# Patient Record
Sex: Female | Born: 1985 | Race: Black or African American | Hispanic: No | Marital: Single
Health system: Southern US, Community
[De-identification: ages and names within clinical notes are randomized; demographics above are authoritative.]

---

## 2012-09-04 ENCOUNTER — Emergency Department: Payer: Self-pay | Admitting: Emergency Medicine

## 2012-09-04 LAB — CBC
HGB: 13.9 g/dL (ref 12.0–16.0)
MCHC: 32.9 g/dL (ref 32.0–36.0)
Platelet: 283 10*3/uL (ref 150–440)
RBC: 4.74 10*6/uL (ref 3.80–5.20)
RDW: 13.4 % (ref 11.5–14.5)
WBC: 14.5 10*3/uL — ABNORMAL HIGH (ref 3.6–11.0)

## 2012-09-04 LAB — URINALYSIS, COMPLETE
Bilirubin,UR: NEGATIVE
RBC,UR: 2 /HPF (ref 0–5)
Specific Gravity: 1.028 (ref 1.003–1.030)
Squamous Epithelial: 20
WBC UR: 14 /HPF (ref 0–5)

## 2012-09-04 LAB — DRUG SCREEN, URINE
Amphetamines, Ur Screen: NEGATIVE (ref ?–1000)
Benzodiazepine, Ur Scrn: NEGATIVE (ref ?–200)
Cocaine Metabolite,Ur ~~LOC~~: NEGATIVE (ref ?–300)
Methadone, Ur Screen: NEGATIVE (ref ?–300)
Opiate, Ur Screen: NEGATIVE (ref ?–300)
Tricyclic, Ur Screen: NEGATIVE (ref ?–1000)

## 2012-09-04 LAB — COMPREHENSIVE METABOLIC PANEL
Alkaline Phosphatase: 110 U/L (ref 50–136)
BUN: 7 mg/dL (ref 7–18)
Co2: 24 mmol/L (ref 21–32)
Creatinine: 0.7 mg/dL (ref 0.60–1.30)
EGFR (African American): >60
EGFR (Non-African Amer.): >60
Osmolality: 272 (ref 275–301)
SGPT (ALT): 20 U/L (ref 12–78)

## 2012-09-04 LAB — RAPID HIV-1/2 QL/CONFIRM: HIV-1/2,Rapid Ql: NEGATIVE

## 2012-09-04 LAB — TSH: Thyroid Stimulating Horm: 3.22 u[IU]/mL

## 2012-09-06 ENCOUNTER — Emergency Department: Payer: Self-pay | Admitting: Emergency Medicine

## 2012-09-06 LAB — DRUG SCREEN, URINE
Amphetamines, Ur Screen: NEGATIVE (ref ?–1000)
Cannabinoid 50 Ng, Ur ~~LOC~~: POSITIVE (ref ?–50)
Cocaine Metabolite,Ur ~~LOC~~: NEGATIVE (ref ?–300)
MDMA (Ecstasy)Ur Screen: NEGATIVE (ref ?–500)
Phencyclidine (PCP) Ur S: NEGATIVE (ref ?–25)
Tricyclic, Ur Screen: NEGATIVE (ref ?–1000)

## 2012-09-06 LAB — CBC
HGB: 13.9 g/dL (ref 12.0–16.0)
RBC: 4.72 10*6/uL (ref 3.80–5.20)

## 2012-09-06 LAB — TSH: Thyroid Stimulating Horm: 1.91 u[IU]/mL

## 2012-09-06 LAB — URINALYSIS, COMPLETE
Bilirubin,UR: NEGATIVE
Ph: 5 (ref 4.5–8.0)
RBC,UR: 1 /HPF (ref 0–5)
Squamous Epithelial: 4

## 2012-09-06 LAB — COMPREHENSIVE METABOLIC PANEL
Albumin: 4.1 g/dL (ref 3.4–5.0)
Alkaline Phosphatase: 111 U/L (ref 50–136)
BUN: 12 mg/dL (ref 7–18)
Bilirubin,Total: 0.4 mg/dL (ref 0.2–1.0)
Calcium, Total: 9.2 mg/dL (ref 8.5–10.1)
Chloride: 105 mmol/L (ref 98–107)
Co2: 20 mmol/L — ABNORMAL LOW (ref 21–32)
Creatinine: 0.85 mg/dL (ref 0.60–1.30)
EGFR (African American): >60
EGFR (Non-African Amer.): >60
Osmolality: 275 (ref 275–301)
Potassium: 3.8 mmol/L (ref 3.5–5.1)
SGOT(AST): 29 U/L (ref 15–37)
SGPT (ALT): 21 U/L (ref 12–78)

## 2012-09-06 LAB — ETHANOL: Ethanol %: <0.003 % (ref 0.000–0.080)

## 2012-09-06 LAB — SALICYLATE LEVEL: Salicylates, Serum: 2.2 mg/dL

## 2012-09-07 LAB — URINE CULTURE

## 2013-07-10 ENCOUNTER — Emergency Department: Payer: Self-pay | Admitting: Emergency Medicine

## 2013-07-26 ENCOUNTER — Emergency Department: Payer: Self-pay | Admitting: Emergency Medicine

## 2013-07-29 LAB — BETA STREP CULTURE(ARMC)

## 2013-08-22 ENCOUNTER — Emergency Department: Payer: Self-pay | Admitting: Emergency Medicine

## 2013-08-23 LAB — COMPREHENSIVE METABOLIC PANEL
Anion Gap: 5 — ABNORMAL LOW (ref 7–16)
BUN: 9 mg/dL (ref 7–18)
Bilirubin,Total: 0.4 mg/dL (ref 0.2–1.0)
Calcium, Total: 8.9 mg/dL (ref 8.5–10.1)
Chloride: 107 mmol/L (ref 98–107)
Creatinine: 0.6 mg/dL (ref 0.60–1.30)
Potassium: 3.7 mmol/L (ref 3.5–5.1)
SGOT(AST): 24 U/L (ref 15–37)
Sodium: 137 mmol/L (ref 136–145)
Total Protein: 8.5 g/dL — ABNORMAL HIGH (ref 6.4–8.2)

## 2013-08-23 LAB — CBC WITH DIFFERENTIAL/PLATELET
Basophil #: 0.1 10*3/uL (ref 0.0–0.1)
Eosinophil #: 0 10*3/uL (ref 0.0–0.7)
Eosinophil %: 0.2 %
HGB: 11.8 g/dL — ABNORMAL LOW (ref 12.0–16.0)
Lymphocyte #: 2.6 10*3/uL (ref 1.0–3.6)
Lymphocyte %: 25.8 %
MCH: 30.2 pg (ref 26.0–34.0)
MCV: 88 fL (ref 80–100)
Monocyte #: 0.6 x10 3/mm (ref 0.2–0.9)
Monocyte %: 5.9 %
Neutrophil #: 6.8 10*3/uL — ABNORMAL HIGH (ref 1.4–6.5)
Neutrophil %: 67.6 %
Platelet: 278 10*3/uL (ref 150–440)
RBC: 3.89 10*6/uL (ref 3.80–5.20)

## 2013-08-23 LAB — URINALYSIS, COMPLETE
Blood: NEGATIVE
Leukocyte Esterase: NEGATIVE
Nitrite: NEGATIVE
Ph: 6 (ref 4.5–8.0)
Protein: NEGATIVE
RBC,UR: 1 /HPF (ref 0–5)
Squamous Epithelial: 8
WBC UR: 2 /HPF (ref 0–5)

## 2013-08-25 ENCOUNTER — Emergency Department: Payer: Self-pay | Admitting: Emergency Medicine

## 2013-09-12 ENCOUNTER — Emergency Department: Payer: Self-pay | Admitting: Emergency Medicine

## 2013-10-21 ENCOUNTER — Emergency Department: Payer: Self-pay | Admitting: Emergency Medicine

## 2013-10-21 LAB — URINALYSIS, COMPLETE
BILIRUBIN, UR: NEGATIVE
GLUCOSE, UR: NEGATIVE mg/dL (ref 0–75)
KETONE: NEGATIVE
LEUKOCYTE ESTERASE: NEGATIVE
Nitrite: NEGATIVE
Ph: 7 (ref 4.5–8.0)
Protein: NEGATIVE
RBC,UR: 1 /HPF (ref 0–5)
Specific Gravity: 1.018 (ref 1.003–1.030)

## 2013-10-21 LAB — CBC WITH DIFFERENTIAL/PLATELET
BASOS PCT: 0.9 %
Basophil #: 0.1 10*3/uL (ref 0.0–0.1)
EOS PCT: 1.4 %
Eosinophil #: 0.2 10*3/uL (ref 0.0–0.7)
HCT: 36 % (ref 35.0–47.0)
HGB: 12 g/dL (ref 12.0–16.0)
Lymphocyte #: 5 10*3/uL — ABNORMAL HIGH (ref 1.0–3.6)
Lymphocyte %: 35.2 %
MCH: 28.7 pg (ref 26.0–34.0)
MCHC: 33.3 g/dL (ref 32.0–36.0)
MCV: 86 fL (ref 80–100)
MONO ABS: 0.7 x10 3/mm (ref 0.2–0.9)
Monocyte %: 5.1 %
Neutrophil #: 8.2 10*3/uL — ABNORMAL HIGH (ref 1.4–6.5)
Neutrophil %: 57.4 %
Platelet: 276 10*3/uL (ref 150–440)
RBC: 4.18 10*6/uL (ref 3.80–5.20)
RDW: 12.8 % (ref 11.5–14.5)
WBC: 14.2 10*3/uL — ABNORMAL HIGH (ref 3.6–11.0)

## 2013-10-21 LAB — COMPREHENSIVE METABOLIC PANEL
ALBUMIN: 3.5 g/dL (ref 3.4–5.0)
ALK PHOS: 94 U/L
ANION GAP: 5 — AB (ref 7–16)
AST: 14 U/L — AB (ref 15–37)
BUN: 13 mg/dL (ref 7–18)
Bilirubin,Total: 0.2 mg/dL (ref 0.2–1.0)
CHLORIDE: 103 mmol/L (ref 98–107)
CREATININE: 0.5 mg/dL — AB (ref 0.60–1.30)
Calcium, Total: 9.4 mg/dL (ref 8.5–10.1)
Co2: 26 mmol/L (ref 21–32)
EGFR (African American): >60
Glucose: 83 mg/dL (ref 65–99)
OSMOLALITY: 267 (ref 275–301)
POTASSIUM: 3.5 mmol/L (ref 3.5–5.1)
SGPT (ALT): 20 U/L (ref 12–78)
SODIUM: 134 mmol/L — AB (ref 136–145)
Total Protein: 8.1 g/dL (ref 6.4–8.2)

## 2013-10-21 LAB — HCG, QUANTITATIVE, PREGNANCY: BETA HCG, QUANT.: 73192 m[IU]/mL — AB

## 2013-10-21 LAB — WET PREP, GENITAL

## 2013-10-22 LAB — GC/CHLAMYDIA PROBE AMP

## 2013-12-16 ENCOUNTER — Emergency Department: Payer: Self-pay | Admitting: Emergency Medicine

## 2013-12-16 LAB — COMPREHENSIVE METABOLIC PANEL WITH GFR
Albumin: 2.9 g/dL — ABNORMAL LOW
Alkaline Phosphatase: 89 U/L
Anion Gap: 4 — ABNORMAL LOW
BUN: 9 mg/dL
Bilirubin,Total: 0.1 mg/dL — ABNORMAL LOW
Calcium, Total: 8.7 mg/dL
Chloride: 106 mmol/L
Co2: 25 mmol/L
Creatinine: 0.55 mg/dL — ABNORMAL LOW
EGFR (African American): >60
EGFR (Non-African Amer.): >60
Glucose: 81 mg/dL
Osmolality: 268
Potassium: 3.6 mmol/L
SGOT(AST): 23 U/L
SGPT (ALT): 21 U/L
Sodium: 135 mmol/L — ABNORMAL LOW
Total Protein: 7.5 g/dL

## 2013-12-16 LAB — URINALYSIS, COMPLETE
Bilirubin,UR: NEGATIVE
Blood: NEGATIVE
GLUCOSE, UR: NEGATIVE mg/dL (ref 0–75)
KETONE: NEGATIVE
Leukocyte Esterase: NEGATIVE
Nitrite: NEGATIVE
PH: 6 (ref 4.5–8.0)
Protein: NEGATIVE
RBC,UR: 1 /HPF (ref 0–5)
Specific Gravity: 1.023 (ref 1.003–1.030)
Squamous Epithelial: 3
WBC UR: 1 /HPF (ref 0–5)

## 2013-12-16 LAB — CBC
HCT: 33.4 % — AB (ref 35.0–47.0)
HGB: 11.1 g/dL — AB (ref 12.0–16.0)
MCH: 29 pg (ref 26.0–34.0)
MCHC: 33.2 g/dL (ref 32.0–36.0)
MCV: 87 fL (ref 80–100)
Platelet: 268 10*3/uL (ref 150–440)
RBC: 3.83 10*6/uL (ref 3.80–5.20)
RDW: 13.5 % (ref 11.5–14.5)
WBC: 15.9 10*3/uL — AB (ref 3.6–11.0)

## 2013-12-16 LAB — HCG, QUANTITATIVE, PREGNANCY: Beta Hcg, Quant.: 72794 m[IU]/mL — ABNORMAL HIGH

## 2014-02-14 IMAGING — CT CT ABD-PELV W/ CM
1 of 2 series · 15 of 32 positions shown, 19 images · non-contrast
Comparison: none

REASON FOR EXAM: (1) LLQ pain, leukocytosis; (2) LLQ pain, leukocytosis
COMMENTS:

[Series 2: 3mm soft tissue · axial · 0.64mm/px · z∈[-474,-87]mm · 15 of 141 slices shown, 19 images]
[im 6/141  soft-tissue]
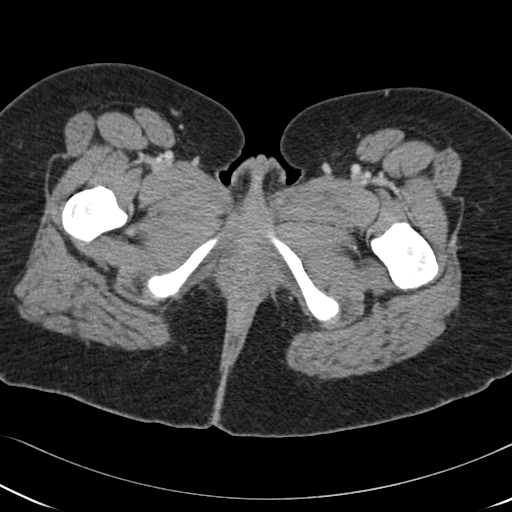
[im 6/141  bone]
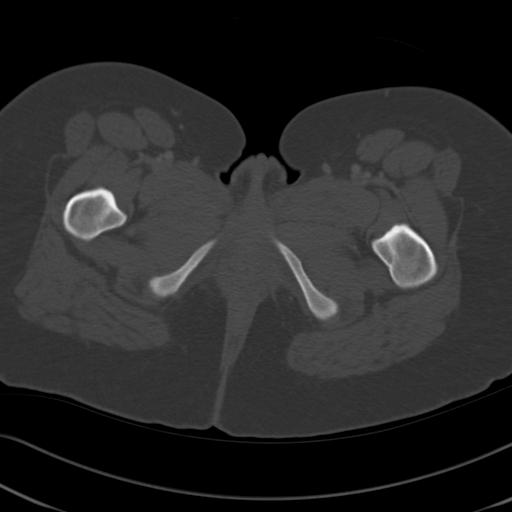
[im 18/141  soft-tissue]
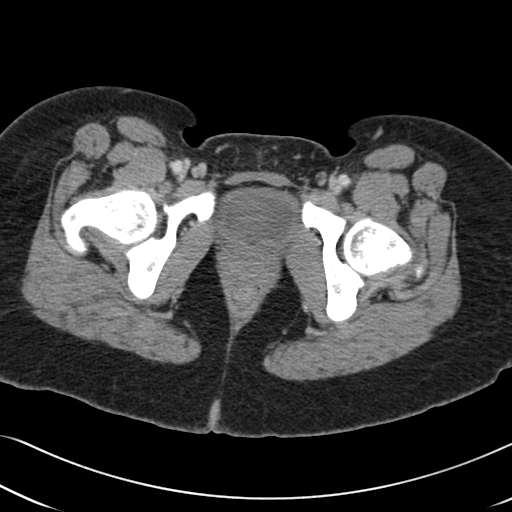
[im 30/141  soft-tissue]
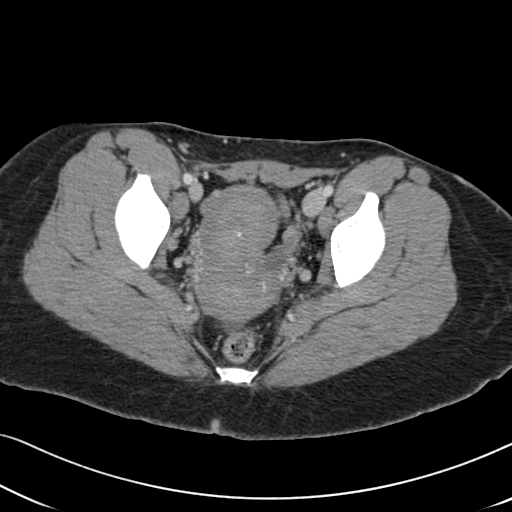
[im 41/141  soft-tissue]
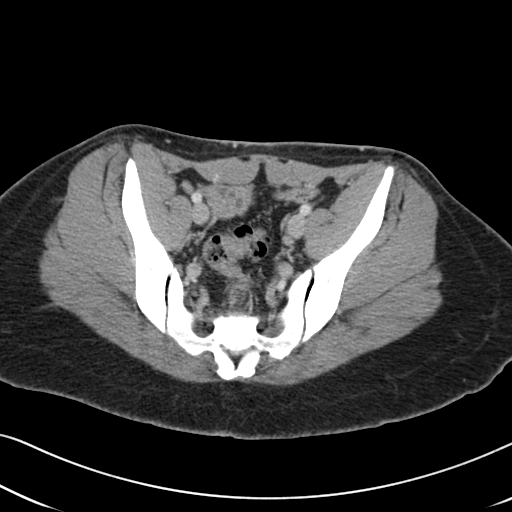
[im 47/141  soft-tissue]
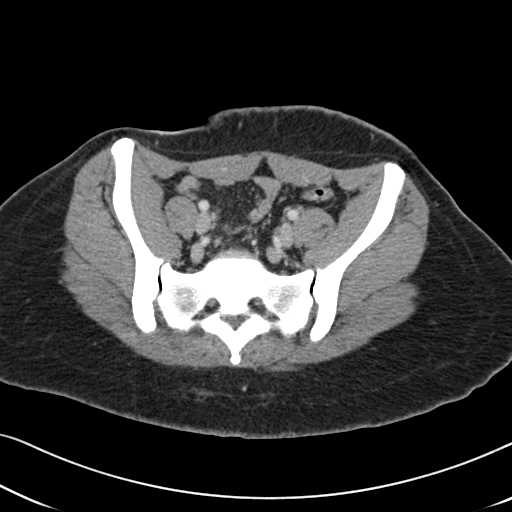
[im 59/141  soft-tissue]
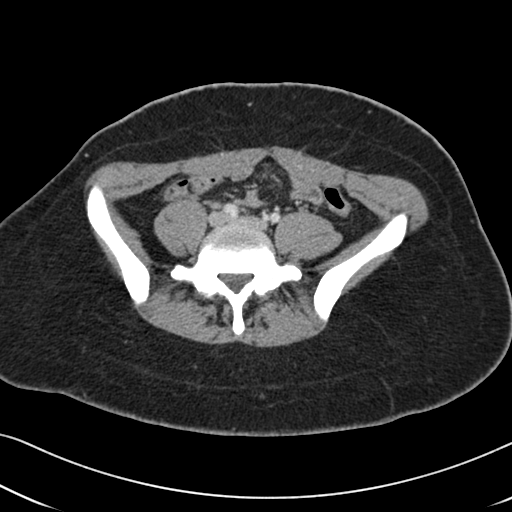
[im 71/141  soft-tissue]
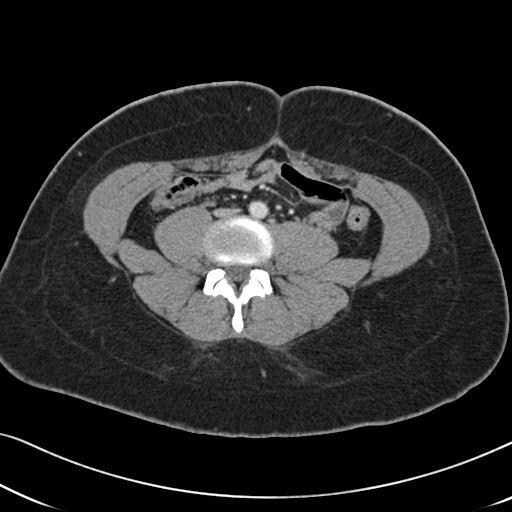
[im 82/141  soft-tissue]
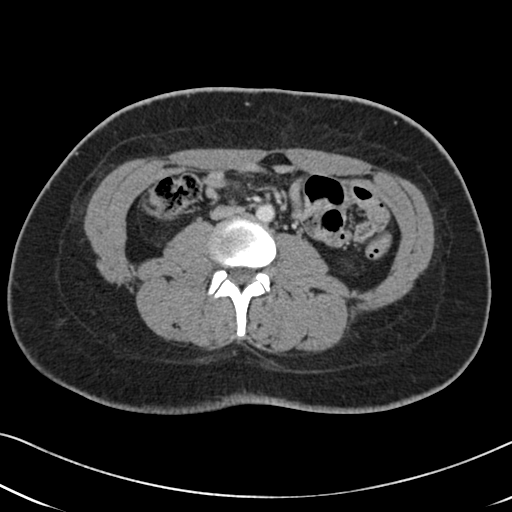
[im 94/141  soft-tissue]
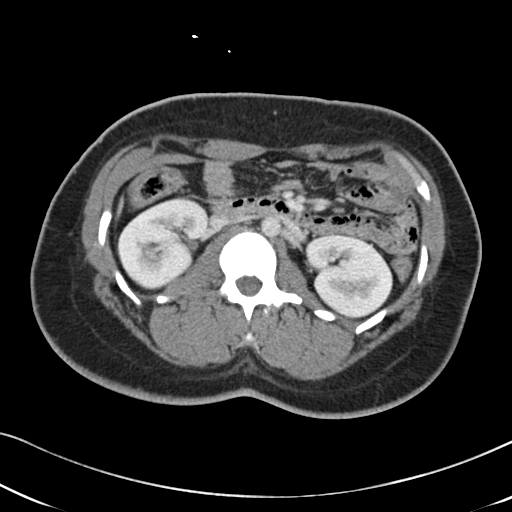
[im 94/141  bone]
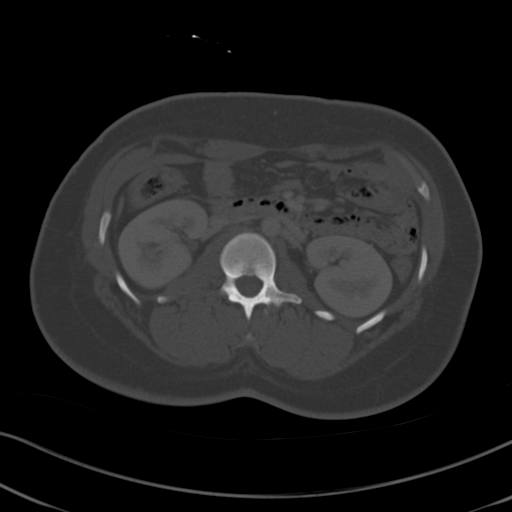
[im 100/141  soft-tissue]
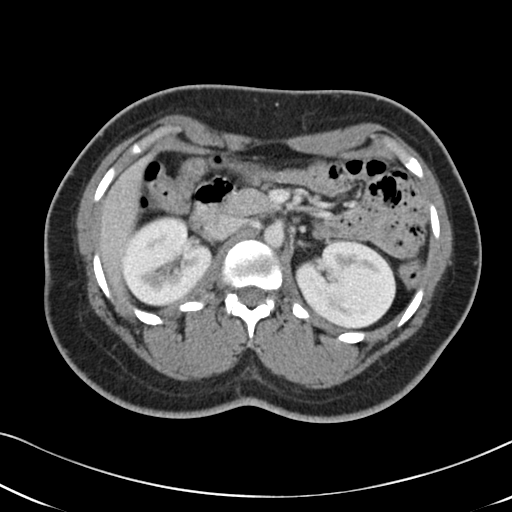
[im 111/141  soft-tissue]
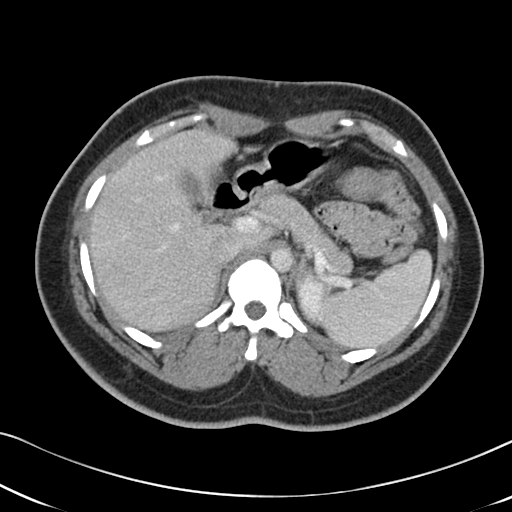
[im 117/141  lung]
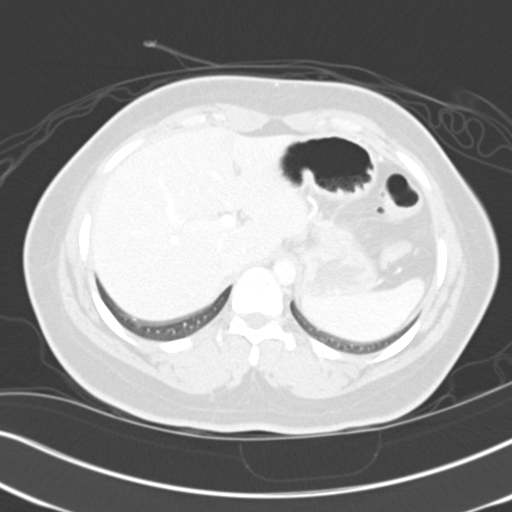
[im 123/141  soft-tissue]
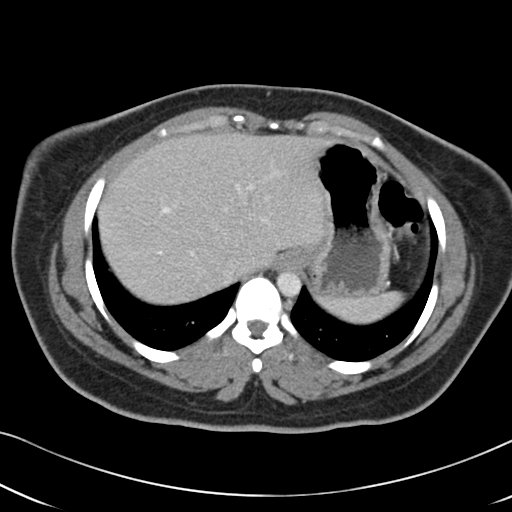
[im 123/141  lung]
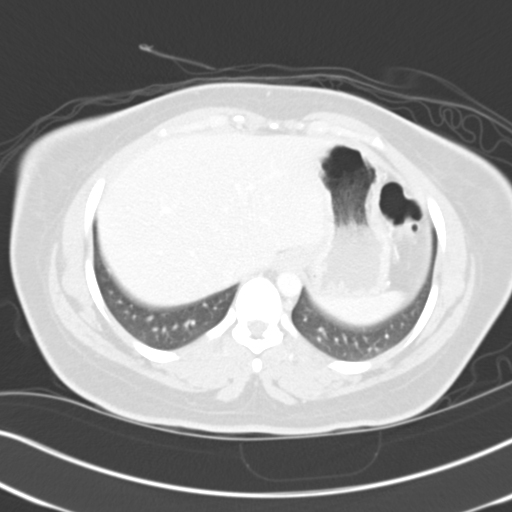
[im 129/141  lung]
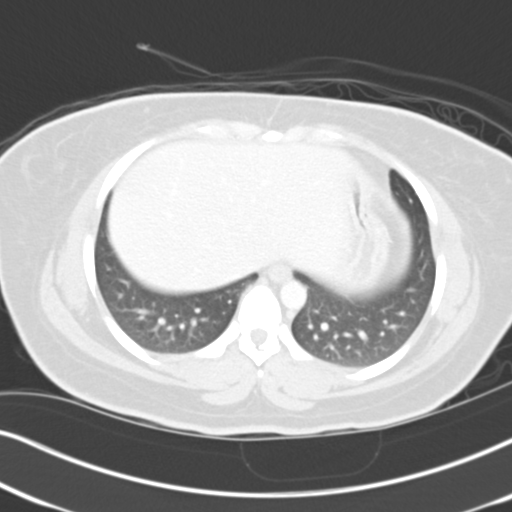
[im 135/141  soft-tissue]
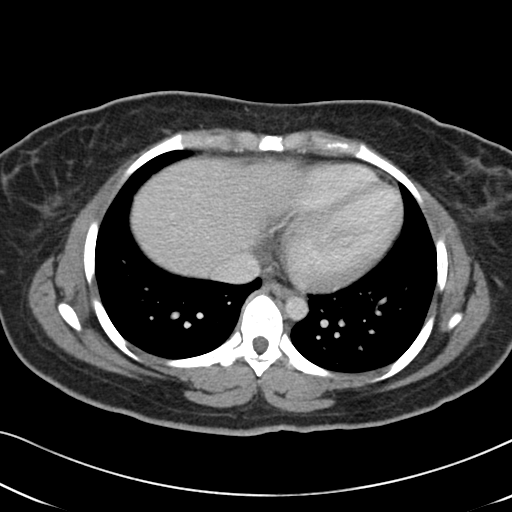
[im 135/141  lung]
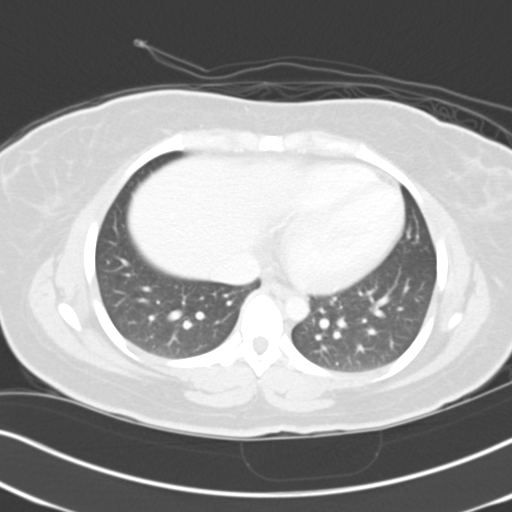

[15 of 32 positions shown; findings below may reference images not displayed]

PROCEDURE:     CT  - CT ABDOMEN / PELVIS  W  - September 04, 2012  [DATE]

RESULT:     CT of the abdomen and pelvis is performed with 75 mL of
Tsovue-YTN iodinated intravenous contrast. No oral contrast is utilized.
Images are reconstructed at 3 mm slice thickness in the axial plane. There
is no previous study for comparison.

There is a cystic structure measuring 2.3 cm diameter on image #108 in the
left adnexal region most likely a simple left ovarian cyst or dominant
follicle. There is no evidence of abnormal bowel distention or wall
thickening. The lung bases show no pleural effusion. There is no
pneumothorax. There is minimal dependent atelectasis without a focal
pulmonary nodule inferiorly. There is some respiratory motion artifact. The
liver shows no mass or ductal dilation. The gallbladder, pancreas, spleen,
aorta, kidneys and adrenal glands appear unremarkable. There is no acute
inflammatory process. A normal-appearing appendix is present. No abnormal
bowel distention is demonstrated. An intrauterine device is present in the
uterus. The urinary bladder is nondistended.
IMPRESSION: 1. Left ovarian cyst or dominant follicle with a trace amount of fluid in
the pelvis which is likely physiologic.

[REDACTED]

## 2014-04-01 ENCOUNTER — Emergency Department: Payer: Self-pay | Admitting: Emergency Medicine

## 2015-02-05 NOTE — Consult Note (Signed)
PATIENT NAME:  Stacie Mathis, ALKINS MR#:  [id number] DATE OF BIRTH:  November 21, 1985  DATE OF CONSULTATION:  09/05/2012  REFERRING PHYSICIAN:  Lowella Fairy, MD  CONSULTING PHYSICIAN:  Livio Ledwith B. Isador Castille, MD  REASON FOR CONSULTATION: To evaluate a patient with altered mental status.   IDENTIFYING DATA: Stacie Mathis is a 29 year old female with no past psychiatric history.   CHIEF COMPLAINT: "I have a bellyache".  HISTORY OF PRESENT ILLNESS: Stacie Mathis showed up in the Emergency Room initially complaining of abdominal pain, very frightened that unprotected sex she had two weeks ago might result in sexually transmitted disease, especially HIV. She been very anxious and decided to come to the hospital for testing. She was rather strange and not forthcoming with any information in the Emergency Room and this led to her involuntary inpatient psychiatric commitment. I do not believe that at any time the patient threatened suicide or homicide but was fair uncooperative and difficult to evaluate. By the time I get to see her the next morning, she is pleasant, polite, and cooperative and much more forthcoming with information. We were also able to get in touch with her aunt who brought her up and her cousin who provided some information. The patient reports that she has no mental illness problems and is not interested in treatment. Per her cousin the patient has postpartum depression even though her child is three years old but has not been willing to seek any help for that. She has a pattern of agitated uncontrolled behavior that leads to job losses. The family denies that there is any problem with alcohol or drugs. They would like the patient to be treated but they know that the patient ordinarily refuses. The patient herself denies any symptoms of depression, anxiety, or psychosis. There are no symptoms of bipolar mania. She denies alcohol, illicit drugs, or prescription pill abuse.   PAST PSYCHIATRIC HISTORY: As  above, possibility of postpartum depression never treated. The family thinks that she is bipolar.   FAMILY PSYCHIATRIC HISTORY: None reported.   PAST MEDICAL HISTORY: None.   ALLERGIES: No known drug allergies.   MEDICATIONS ON ADMISSION: None.   SOCIAL HISTORY: She lives with her 31-year-old daughter. She was brought up by her aunt. The patient and her cousin grew up together. Unfortunately, the patient has not been able to keep a job. 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 The patient thinks that this is because she had no place to stay in [place]. She takes good care of her 92-year-old child. There is no child support. She was last employed two weeks ago and has been looking for a job since.  REVIEW OF SYSTEMS: CONSTITUTIONAL: No fevers or chills. No weight changes. EYES: No double or blurred vision. ENT: No hearing loss. RESPIRATORY: No shortness of breath or cough. CARDIOVASCULAR: No chest pain or orthopnea. GASTROINTESTINAL: No abdominal pain, nausea, vomiting, or diarrhea. GU: No incontinence or frequency. ENDOCRINE: No heat or cold intolerance. LYMPHATIC: No anemia or easy bruising. INTEGUMENTARY: No acne or rash. MUSCULOSKELETAL: No muscle or joint pain. NEUROLOGIC: No tingling or weakness. PSYCHIATRIC: See history of present illness for details.   PHYSICAL EXAMINATION:   VITAL SIGNS: Blood pressure 123/61, pulse 84, respirations 18, temperature 98.   GENERAL: This is a well developed young female in no acute distress.   The rest of the physical examination is deferred to her primary attending.   LABORATORY  DATA: Chemistries are within normal limits except for blood glucose of 105. LFTs within normal limits except for high protein of 9.1. TSH 3.22. Urine tox screen positive for cannabinoids. CBC within normal limits except for white count of 14.5. Urinalysis is suggestive of urinary tract infection. Serum  acetaminophen less than 2. Rapid HIV test negative. Wet prep many white blood cells, no yeast, no Trichomonas, no spermatozoa.  MENTAL STATUS EXAMINATION: The patient is alert and oriented to person, place, time, and situation. She is pleasant, polite, and cooperative. She was not forthcoming with information, hiding under covers with intake nurse last night and this very morning. She is wearing hospital scrubs and a yellow shirt. She maintains good eye contact. Her speech is of normal rhythm, rate, and volume. Mood is fine with full affect. Thought processing is logical and goal oriented. She denies suicidal or homicidal ideation. There are no delusions or paranoia. There are no auditory or visual hallucinations. Her cognition is grossly intact. Her insight and judgment is questionable.   SUICIDE RISK ASSESSMENT: This is a patient with no past psychiatric history who came to the hospital for abdominal pain and STD testing. She lost her temper and was put in a psychiatric bed. She is no longer suicidal or homicidal. She wants to be discharged.   DIAGNOSES:  AXIS I:  1. Mood disorder, not otherwise specified.  2. Marijuana abuse.   AXIS II: Deferred.   AXIS III: None.   AXIS IV: Mental illness, employment, financial, primary support.   AXIS V: GAF 45.   PLAN:  1. The patient no longer meets criteria for involuntary inpatient psychiatric commitment. Please discharge as appropriate. I will terminate proceedings. 2. I offered Tegretol for mood stabilization and impulse control but the patient refused to take it.  3. She will follow-up with [facility].  4. She will need a cab to go home.   ____________________________ Ellin GoodieJolanta B. Jennet MaduroPucilowska, MD jbp:drc D: 09/05/2012 16:24:55 ET T: 09/05/2012 17:24:23 ET JOB#: [id number]  cc: Daveyon Kitchings B. Jennet MaduroPucilowska, MD, <Dictator> Shari ProwsJOLANTA B Tryton Bodi MD ELECTRONICALLY SIGNED 09/08/2012 8:12

## 2015-02-05 NOTE — Consult Note (Signed)
Brief Consult Note: Diagnosis: Mood disorder NOS.   Patient was seen by consultant.   Consult note dictated.   Recommend further assessment or treatment.   Orders entered.   Discussed with Attending MD.   Comments: Ms. Blaine Hamperaborn has no psychiatric history. She came to ER with abdominal pain worried of STD infection from unprotected sex 2 weeks ago. She was anxious and upset last night. She is cool and collected. She is not suicidal, homicidal or excessively anxious.   PLAN: 1. The patient no longer meets criteria for IVC. I will terminate prodeecings. Plerase discharege as appropriate.   2. I will start Tegretol 200 mg twice daily for mood instability. Rx provided.  3. She will follow up with [facility] on 11/20 at 3:00 pm.   4. She will need a cab to get home.  Electronic Signatures: Kristine LineaPucilowska, Osby Sweetin (MD)  (Signed [phone]-Nov-13 13:21)  Authored: Brief Consult Note   Last Updated: 18-Nov-13 13:21 by Kristine LineaPucilowska, Nashea Chumney (MD)
